# Patient Record
Sex: Female | Born: 1935 | Race: White | Hispanic: No | Smoking: Never smoker
Health system: Southern US, Community
[De-identification: ages and names within clinical notes are randomized; demographics above are authoritative.]

## PROBLEM LIST (undated history)

## (undated) DIAGNOSIS — T7840XA Allergy, unspecified, initial encounter: Secondary | ICD-10-CM

## (undated) HISTORY — PX: APPENDECTOMY: SHX54

## (undated) HISTORY — DX: Allergy, unspecified, initial encounter: T78.40XA

---

## 2015-01-22 ENCOUNTER — Ambulatory Visit (INDEPENDENT_AMBULATORY_CARE_PROVIDER_SITE_OTHER): Payer: Medicare Other | Admitting: Physician Assistant

## 2015-01-22 VITALS — BP 144/80 | HR 87 | Temp 97.7°F | Resp 20 | Ht 62.0 in | Wt 147.0 lb

## 2015-01-22 DIAGNOSIS — H6123 Impacted cerumen, bilateral: Secondary | ICD-10-CM | POA: Diagnosis not present

## 2015-01-22 DIAGNOSIS — Z23 Encounter for immunization: Secondary | ICD-10-CM | POA: Diagnosis not present

## 2015-01-22 DIAGNOSIS — Z139 Encounter for screening, unspecified: Secondary | ICD-10-CM | POA: Diagnosis not present

## 2015-01-22 NOTE — Progress Notes (Signed)
01/22/2015 5:21 PM   DOB: 27-Mar-1927 / MRN: [id number]  SUBJECTIVE:  Teresa Beltran is a 82 y.o. female presenting for a letter stating that she is healthy enough to move into an independent living facility.  She has had a hysterectomy in the 70's and appendectomy when she was in high school.  She has also had her tonsils removed.  She quit smoking in 1993.  She has a 40 pack year history.  She takes no medication and has no complaints today. She had a pneumonia shot two years ago.  She would like a flu shot.      She has No Known Allergies.   She  has a past medical history of Allergy.    She  reports that she has never smoked. She does not have any smokeless tobacco history on file. She reports that she drinks about 0.6 oz of alcohol per week. She reports that she does not use illicit drugs. She  has no sexual activity history on file. The patient  has past surgical history that includes Appendectomy.  Her family history includes Cancer in her mother and sister; Stroke in her father.  Review of Systems  Constitutional: Negative for fever and chills.  Eyes: Negative for blurred vision.  Respiratory: Negative for cough and shortness of breath.   Cardiovascular: Negative for chest pain.  Gastrointestinal: Negative for nausea and abdominal pain.  Genitourinary: Negative for dysuria, urgency and frequency.  Musculoskeletal: Negative for myalgias.  Skin: Negative for rash.  Neurological: Negative for dizziness, tingling and headaches.  Psychiatric/Behavioral: Negative for depression. The patient is not nervous/anxious.     Problem list and medications reviewed and updated by myself where necessary, and exist elsewhere in the encounter.   OBJECTIVE:  BP 144/80 mmHg  Pulse 87  Temp(Src) 97.7 F (36.5 C) (Oral)  Resp 20  Ht 5\' 2"  (1.575 m)  Wt 147 lb (66.679 kg)  BMI 26.88 kg/m2  SpO2 98%  Physical Exam  Constitutional: She is oriented to person, place, and time. She appears  well-developed.  HENT:  Nose: Nose normal.  Mouth/Throat: Uvula is midline, oropharynx is clear and moist and mucous membranes are normal.  Bilateral cerumen impaction.   Eyes: EOM are normal. Pupils are equal, round, and reactive to light.  Cardiovascular: Normal rate and regular rhythm.   Pulmonary/Chest: Effort normal and breath sounds normal.  Abdominal: Soft. Bowel sounds are normal. She exhibits no distension.  Musculoskeletal: Normal range of motion.  Neurological: She is alert and oriented to person, place, and time. No cranial nerve deficit.  Skin: Skin is warm and dry. She is not diaphoretic.  Psychiatric: She has a normal mood and affect.  Vitals reviewed.   No results found for this or any previous visit (from the past 48 hour(s)).  ASSESSMENT AND PLAN  Teresa Beltran was seen today for other.  Diagnoses and all orders for this visit:  Screening: Letter composed and signed.  Patient and her daughter opting to avoid labs at this time.  Will provide a flu shot.  She has recently had a pneumococcal vaccine.    Cerumen impaction, bilateral: Successful lavage bilaterally.   Need for prophylactic vaccination and inoculation against influenza -     Cancel: Flu vaccine > 3yo with preservative IM (Fluvirin Influenza Split) -     Flu Vaccine QUAD 36+ mos IM   The patient was advised to call or return to clinic if she does not see an improvement in symptoms or to  seek the care of the closest emergency department if she worsens with the above plan.   Deliah BostonMichael Briceyda Abdullah, MHS, PA-C [facility] 01/22/2015 5:21 PM

## 2019-02-24 ENCOUNTER — Emergency Department (HOSPITAL_COMMUNITY): Payer: Medicare Other

## 2019-02-24 ENCOUNTER — Other Ambulatory Visit: Payer: Self-pay

## 2019-02-24 ENCOUNTER — Inpatient Hospital Stay (HOSPITAL_COMMUNITY): Payer: Medicare Other

## 2019-02-24 ENCOUNTER — Inpatient Hospital Stay (HOSPITAL_COMMUNITY)
Admission: EM | Admit: 2019-02-24 | Discharge: 2019-02-28 | DRG: 083 | Disposition: A | Discharge status: Hospice | Payer: Medicare Other | Attending: Physician Assistant | Admitting: Physician Assistant

## 2019-02-24 ENCOUNTER — Encounter (HOSPITAL_COMMUNITY): Payer: Self-pay

## 2019-02-24 DIAGNOSIS — S06360A Traumatic hemorrhage of cerebrum, unspecified, without loss of consciousness, initial encounter: Secondary | ICD-10-CM | POA: Diagnosis not present

## 2019-02-24 DIAGNOSIS — S065X9A Traumatic subdural hemorrhage with loss of consciousness of unspecified duration, initial encounter: Secondary | ICD-10-CM | POA: Diagnosis present

## 2019-02-24 DIAGNOSIS — Z20822 Contact with and (suspected) exposure to covid-19: Secondary | ICD-10-CM | POA: Diagnosis present

## 2019-02-24 DIAGNOSIS — Z66 Do not resuscitate: Secondary | ICD-10-CM | POA: Diagnosis not present

## 2019-02-24 DIAGNOSIS — R451 Restlessness and agitation: Secondary | ICD-10-CM | POA: Diagnosis not present

## 2019-02-24 DIAGNOSIS — Z90710 Acquired absence of both cervix and uterus: Secondary | ICD-10-CM

## 2019-02-24 DIAGNOSIS — M4856XA Collapsed vertebra, not elsewhere classified, lumbar region, initial encounter for fracture: Secondary | ICD-10-CM | POA: Diagnosis present

## 2019-02-24 DIAGNOSIS — S066X9A Traumatic subarachnoid hemorrhage with loss of consciousness of unspecified duration, initial encounter: Secondary | ICD-10-CM | POA: Diagnosis present

## 2019-02-24 DIAGNOSIS — I619 Nontraumatic intracerebral hemorrhage, unspecified: Secondary | ICD-10-CM | POA: Diagnosis present

## 2019-02-24 DIAGNOSIS — Z515 Encounter for palliative care: Secondary | ICD-10-CM | POA: Diagnosis not present

## 2019-02-24 DIAGNOSIS — R0681 Apnea, not elsewhere classified: Secondary | ICD-10-CM | POA: Diagnosis not present

## 2019-02-24 DIAGNOSIS — W108XXA Fall (on) (from) other stairs and steps, initial encounter: Secondary | ICD-10-CM | POA: Diagnosis present

## 2019-02-24 DIAGNOSIS — Z7289 Other problems related to lifestyle: Secondary | ICD-10-CM | POA: Diagnosis not present

## 2019-02-24 DIAGNOSIS — Z7189 Other specified counseling: Secondary | ICD-10-CM

## 2019-02-24 DIAGNOSIS — R609 Edema, unspecified: Secondary | ICD-10-CM

## 2019-02-24 LAB — CBC
HCT: 45 % (ref 36.0–46.0)
Hemoglobin: 14.3 g/dL (ref 12.0–15.0)
MCH: 30.4 pg (ref 26.0–34.0)
MCHC: 31.8 g/dL (ref 30.0–36.0)
MCV: 95.7 fL (ref 80.0–100.0)
Platelets: 257 10*3/uL (ref 150–400)
RBC: 4.7 MIL/uL (ref 3.87–5.11)
RDW: 13.6 % (ref 11.5–15.5)
WBC: 23.8 10*3/uL — ABNORMAL HIGH (ref 4.0–10.5)
nRBC: 0 % (ref 0.0–0.2)

## 2019-02-24 LAB — COMPREHENSIVE METABOLIC PANEL
ALT: 19 U/L (ref 0–44)
AST: 27 U/L (ref 15–41)
Albumin: 3.2 g/dL — ABNORMAL LOW (ref 3.5–5.0)
Alkaline Phosphatase: 80 U/L (ref 38–126)
Anion gap: 12 (ref 5–15)
BUN: 20 mg/dL (ref 8–23)
CO2: 23 mmol/L (ref 22–32)
Calcium: 9.1 mg/dL (ref 8.9–10.3)
Chloride: 105 mmol/L (ref 98–111)
Creatinine, Ser: 0.95 mg/dL (ref 0.44–1.00)
GFR calc Af Amer: >60 mL/min (ref >60)
GFR calc non Af Amer: 53 mL/min — ABNORMAL LOW (ref >60)
Glucose, Bld: 187 mg/dL — ABNORMAL HIGH (ref 70–99)
Potassium: 3.8 mmol/L (ref 3.5–5.1)
Sodium: 140 mmol/L (ref 135–145)
Total Bilirubin: 0.8 mg/dL (ref 0.3–1.2)
Total Protein: 6.4 g/dL — ABNORMAL LOW (ref 6.5–8.1)

## 2019-02-24 LAB — RESPIRATORY PANEL BY RT PCR (FLU A&B, COVID)
Influenza A by PCR: NEGATIVE
Influenza B by PCR: NEGATIVE
SARS Coronavirus 2 by RT PCR: NEGATIVE

## 2019-02-24 LAB — URINALYSIS, ROUTINE W REFLEX MICROSCOPIC
Bilirubin Urine: NEGATIVE
Glucose, UA: 50 mg/dL — AB
Ketones, ur: NEGATIVE mg/dL
Nitrite: NEGATIVE
Protein, ur: 30 mg/dL — AB
RBC / HPF: >50 RBC/hpf — ABNORMAL HIGH (ref 0–5)
Specific Gravity, Urine: 1.032 — ABNORMAL HIGH (ref 1.005–1.030)
WBC, UA: >50 WBC/hpf — ABNORMAL HIGH (ref 0–5)
pH: 6 (ref 5.0–8.0)

## 2019-02-24 LAB — PROTIME-INR
INR: 1 (ref 0.8–1.2)
Prothrombin Time: 12.5 seconds (ref 11.4–15.2)

## 2019-02-24 LAB — MRSA PCR SCREENING: MRSA by PCR: NEGATIVE

## 2019-02-24 MED ORDER — METHOCARBAMOL 1000 MG/10ML IJ SOLN
1000.0000 mg | Freq: Three times a day (TID) | INTRAVENOUS | Status: DC
  Administered 2019-02-24 – 2019-02-26 (×6): 1000 mg via INTRAVENOUS
  Filled 2019-02-24 (×10): qty 10

## 2019-02-24 MED ORDER — MORPHINE SULFATE (PF) 2 MG/ML IV SOLN
1.0000 mg | INTRAVENOUS | Status: DC | PRN
  Administered 2019-02-24: 2 mg via INTRAVENOUS
  Filled 2019-02-24: qty 1

## 2019-02-24 MED ORDER — DOCUSATE SODIUM 100 MG PO CAPS: 100.0000 mg | ORAL_CAPSULE | Freq: Two times a day (BID) | ORAL | Status: DC

## 2019-02-24 MED ORDER — SODIUM CHLORIDE 0.9 % IV SOLN: INTRAVENOUS | Status: DC

## 2019-02-24 MED ORDER — LEVETIRACETAM IN NACL 500 MG/100ML IV SOLN
500.0000 mg | Freq: Once | INTRAVENOUS | Status: AC
  Administered 2019-02-24: 500 mg via INTRAVENOUS
  Filled 2019-02-24: qty 100

## 2019-02-24 MED ORDER — IOHEXOL 300 MG/ML  SOLN
100.0000 mL | Freq: Once | INTRAMUSCULAR | Status: AC | PRN
  Administered 2019-02-24: 100 mL via INTRAVENOUS

## 2019-02-24 MED ORDER — ONDANSETRON HCL 4 MG/2ML IJ SOLN: 4.0000 mg | Freq: Four times a day (QID) | INTRAMUSCULAR | Status: DC | PRN

## 2019-02-24 MED ORDER — LEVETIRACETAM IN NACL 500 MG/100ML IV SOLN: 500.0000 mg | Freq: Two times a day (BID) | INTRAVENOUS | Status: DC

## 2019-02-24 MED ORDER — ACETAMINOPHEN 10 MG/ML IV SOLN
1000.0000 mg | Freq: Four times a day (QID) | INTRAVENOUS | Status: AC
  Administered 2019-02-24 (×3): 1000 mg via INTRAVENOUS
  Filled 2019-02-24 (×3): qty 100

## 2019-02-24 MED ORDER — LABETALOL HCL 5 MG/ML IV SOLN
5.0000 mg | Freq: Once | INTRAVENOUS | Status: AC
  Administered 2019-02-24: 5 mg via INTRAVENOUS
  Filled 2019-02-24: qty 4

## 2019-02-24 MED ORDER — LEVETIRACETAM IN NACL 500 MG/100ML IV SOLN
500.0000 mg | Freq: Two times a day (BID) | INTRAVENOUS | Status: DC
  Administered 2019-02-24 – 2019-02-28 (×9): 500 mg via INTRAVENOUS
  Filled 2019-02-24 (×10): qty 100

## 2019-02-24 MED ORDER — CHLORHEXIDINE GLUCONATE CLOTH 2 % EX PADS
6.0000 | MEDICATED_PAD | Freq: Every day | CUTANEOUS | Status: DC
  Administered 2019-02-24 – 2019-02-25 (×2): 6 via TOPICAL

## 2019-02-24 MED ORDER — ONDANSETRON 4 MG PO TBDP: 4.0000 mg | ORAL_TABLET | Freq: Four times a day (QID) | ORAL | Status: DC | PRN

## 2019-02-24 NOTE — ED Provider Notes (Addendum)
[facility] EMERGENCY DEPARTMENT Provider Note   CSN: [id number] Arrival date & time: 02/24/19  0429     History Chief Complaint  Patient presents with  . Fall    Teresa Beltran is a 86 y.o. female.  Patient presents to the emergency department for evaluation after a fall downstairs.  She comes via EMS.  Patient is reportedly staying with her daughter, got up to go to the bathroom and mistakenly fell down the steps into the cellar.  There are approximately 10 steps.  Patient with multiple skin tears and a large hematoma of the left forehead.  Patient has no past medical history, takes no medications.  EMS was told by the patient's daughter that she is normally very sharp, alert and oriented.  She has been confused during transport but awake and alert. Level V Caveat due to mental status change.        Past Medical History:  Diagnosis Date  . Allergy     There are no problems to display for this patient.   Past Surgical History:  Procedure Laterality Date  . APPENDECTOMY       OB History   No obstetric history on file.     Family History  Problem Relation Age of Onset  . Cancer Mother   . Stroke Father   . Cancer Sister     Social History   Tobacco Use  . Smoking status: Never Smoker  . Smokeless tobacco: Never Used  Substance Use Topics  . Alcohol use: Yes    Alcohol/week: 1.0 standard drinks    Types: 1 Standard drinks or equivalent per week  . Drug use: No    Home Medications Prior to Admission medications   Not on File    Allergies    Patient has no known allergies.  Review of Systems   Review of Systems  Unable to perform ROS: Mental status change    Physical Exam Updated Vital Signs BP (!) 148/85 (BP Location: Left Wrist)   Pulse 71   Temp (!) 97.4 F (36.3 C) (Oral)   Resp 17   SpO2 99%   Physical Exam Vitals and nursing note reviewed.  Constitutional:      General: She is not in acute distress.  Appearance: She is well-developed.  HENT:     Head: Normocephalic. Contusion (large, left forehead) present.     Right Ear: Hearing normal.     Left Ear: Hearing normal.     Nose: Nose normal.  Eyes:     Conjunctiva/sclera: Conjunctivae normal.     Pupils: Pupils are equal, round, and reactive to light.  Cardiovascular:     Rate and Rhythm: Regular rhythm.     Heart sounds: S1 normal and S2 normal. No murmur. No friction rub. No gallop.   Pulmonary:     Effort: Pulmonary effort is normal. No respiratory distress.     Breath sounds: Normal breath sounds.  Chest:     Chest wall: No tenderness.  Abdominal:     General: Bowel sounds are normal.     Palpations: Abdomen is soft.     Tenderness: There is no abdominal tenderness. There is no guarding or rebound. Negative signs include Murphy's sign and McBurney's sign.     Hernia: No hernia is present.  Musculoskeletal:        General: Normal range of motion.     Cervical back: Normal range of motion and neck supple.  Skin:    General:  Skin is warm and dry.     Comments: Skin tear right upper arm, dorsum right hand, left upper arm and right knee  Neurological:     Mental Status: She is alert. She is disoriented.     GCS: GCS eye subscore is 4. GCS verbal subscore is 4. GCS motor subscore is 6.     Cranial Nerves: No cranial nerve deficit.     Sensory: No sensory deficit.     Coordination: Coordination normal.     ED Results / Procedures / Treatments   Labs (all labs ordered are listed, but only abnormal results are displayed) Labs Reviewed  CBC - Abnormal; Notable for the following components:      Result Value   WBC 23.8 (*)    All other components within normal limits  COMPREHENSIVE METABOLIC PANEL - Abnormal; Notable for the following components:   Glucose, Bld 187 (*)    Total Protein 6.4 (*)    Albumin 3.2 (*)    GFR calc non Af Amer 53 (*)    All other components within normal limits  RESPIRATORY PANEL BY RT PCR (FLU  A&B, COVID)  PROTIME-INR  URINALYSIS, ROUTINE W REFLEX MICROSCOPIC  I-STAT CHEM 8, ED    EKG EKG Interpretation  Date/Time:  Monday February 24 2019 04:36:11 EST Ventricular Rate:  70 PR Interval:    QRS Duration: 99 QT Interval:  408 QTC Calculation: 441 R Axis:   57 Text Interpretation: Sinus rhythm Minimal ST depression, diffuse leads No previous tracing Confirmed by Gilda Crease [phone]) on 02/24/2019 4:37:15 AM   Radiology DG Pelvis 1-2 Views  Result Date: 02/24/2019 CLINICAL DATA:  Fall down 10 stairs. EXAM: PELVIS - 1-2 VIEW COMPARISON:  None. FINDINGS: Cortical irregularity of the right superior and inferior pubic rami. Pubic symphysis and sacroiliac joints are congruent. Both femoral heads are well-seated in the respective acetabula. Stool ball distends the rectum. IMPRESSION: Cortical irregularity of the right superior and inferior pubic rami, suspicious for fracture, but age indeterminate. Electronically Signed   By: Narda Rutherford M.D.   On: 02/24/2019 05:37   CT HEAD WO CONTRAST  Result Date: 02/24/2019 CLINICAL DATA:  Poly trauma due to fall down steps. EXAM: CT HEAD WITHOUT CONTRAST CT CERVICAL SPINE WITHOUT CONTRAST TECHNIQUE: Multidetector CT imaging of the head and cervical spine was performed following the standard protocol without intravenous contrast. Multiplanar CT image reconstructions of the cervical spine were also generated. COMPARISON:  None. FINDINGS: CT HEAD FINDINGS Brain: Up to 4 cm left occipital hematoma with thin rim of edema. There is moderate regional subarachnoid hemorrhage. High right frontal parenchymal hematoma also measuring up to 4 cm anterior to posterior and with local subarachnoid hemorrhage. Small volume subarachnoid hemorrhage and probable contusion along the inferolateral left temporal lobe. Small subdural hematoma along the left tentorium measuring 3-4 mm in maximal thickness. Trace subarachnoid hemorrhage is also seen along the  right occipital convexity and parasagittal left frontal convexity. No acute infarct, hydrocephalus, or masslike finding. Medial temporal volume loss which is moderate. Vascular: No hyperdense vessel or unexpected calcification. Skull: Negative for fracture. There is a large left frontal scalp hematoma. Sinuses/Orbits: No evidence of injury CT CERVICAL SPINE FINDINGS Alignment: No traumatic malalignment Skull base and vertebrae: Negative for acute fracture Soft tissues and spinal canal: No prevertebral fluid or swelling. No visible canal hematoma. Disc levels:  Ordinary degenerative changes Upper chest: No acute finding. Calcified granuloma at the left apex. Other: Critical Value/emergent results were called  by telephone at the time of interpretation on 02/24/2019 at 4:59 am to provider [facility] , who verbally acknowledged these results. IMPRESSION: 1. History of trauma with right frontal and left occipital parenchymal hematomas measuring up to 4 cm each. There is also multifocal subarachnoid hemorrhage and a trace left tentorial subdural hemorrhage. 2. Left frontal scalp hematoma without calvarial fracture. 3. Negative for cervical spine fracture. Electronically Signed   By: Monte Fantasia M.D.   On: 02/24/2019 05:03   CT CERVICAL SPINE WO CONTRAST  Result Date: 02/24/2019 CLINICAL DATA:  Poly trauma due to fall down steps. EXAM: CT HEAD WITHOUT CONTRAST CT CERVICAL SPINE WITHOUT CONTRAST TECHNIQUE: Multidetector CT imaging of the head and cervical spine was performed following the standard protocol without intravenous contrast. Multiplanar CT image reconstructions of the cervical spine were also generated. COMPARISON:  None. FINDINGS: CT HEAD FINDINGS Brain: Up to 4 cm left occipital hematoma with thin rim of edema. There is moderate regional subarachnoid hemorrhage. High right frontal parenchymal hematoma also measuring up to 4 cm anterior to posterior and with local subarachnoid hemorrhage. Small  volume subarachnoid hemorrhage and probable contusion along the inferolateral left temporal lobe. Small subdural hematoma along the left tentorium measuring 3-4 mm in maximal thickness. Trace subarachnoid hemorrhage is also seen along the right occipital convexity and parasagittal left frontal convexity. No acute infarct, hydrocephalus, or masslike finding. Medial temporal volume loss which is moderate. Vascular: No hyperdense vessel or unexpected calcification. Skull: Negative for fracture. There is a large left frontal scalp hematoma. Sinuses/Orbits: No evidence of injury CT CERVICAL SPINE FINDINGS Alignment: No traumatic malalignment Skull base and vertebrae: Negative for acute fracture Soft tissues and spinal canal: No prevertebral fluid or swelling. No visible canal hematoma. Disc levels:  Ordinary degenerative changes Upper chest: No acute finding. Calcified granuloma at the left apex. Other: Critical Value/emergent results were called by telephone at the time of interpretation on 02/24/2019 at 4:59 am to provider [facility] , who verbally acknowledged these results. IMPRESSION: 1. History of trauma with right frontal and left occipital parenchymal hematomas measuring up to 4 cm each. There is also multifocal subarachnoid hemorrhage and a trace left tentorial subdural hemorrhage. 2. Left frontal scalp hematoma without calvarial fracture. 3. Negative for cervical spine fracture. Electronically Signed   By: Monte Fantasia M.D.   On: 02/24/2019 05:03   DG Chest Port 1 View  Result Date: 02/24/2019 CLINICAL DATA:  Fall down 10 stairs. EXAM: PORTABLE CHEST 1 VIEW COMPARISON:  None. FINDINGS: Patient is rotated.The cardiomediastinal contours are normal for rotation. Multiple skin folds project over both hemi thoraces. Calcified granuloma at the left lung apex. Pulmonary vasculature is normal. No consolidation, pleural effusion, or pneumothorax. Chronic deformity of the left proximal humerus. IMPRESSION:  No evidence of acute traumatic injury. Electronically Signed   By: Keith Rake M.D.   On: 02/24/2019 05:36    Procedures .Critical Care Performed by: Orpah Greek, MD Authorized by: Orpah Greek, MD   Critical care provider statement:    Critical care time (minutes):  35   Critical care time was exclusive of:  Separately billable procedures and treating other patients   Critical care was necessary to treat or prevent imminent or life-threatening deterioration of the following conditions:  Trauma   Critical care was time spent personally by me on the following activities:  Ordering and performing treatments and interventions, ordering and review of laboratory studies, ordering and review of radiographic studies, discussions with consultants, pulse  oximetry, re-evaluation of patient's condition, review of old charts and examination of patient   I assumed direction of critical care for this patient from another provider in my specialty: no     (including critical care time)  Medications Ordered in ED Medications  levETIRAcetam (KEPPRA) IVPB 500 mg/100 mL premix (has no administration in time range)  iohexol (OMNIPAQUE) 300 MG/ML solution 100 mL (100 mLs Intravenous Contrast Given 02/24/19 0537)    ED Course  I have reviewed the triage vital signs and the nursing notes.  Pertinent labs & imaging results that were available during my care of the patient were reviewed by me and considered in my medical decision making (see chart for details).    MDM Rules/Calculators/A&P                     Patient presents to the emergency department after a fall.  Patient accidentally fell down a flight of steps into the cellar of her daughter's house.  It is assumed that she was trying to go to the bathroom and got confused, went through the wrong door.  Patient is reportedly normally alert and oriented but is having difficulty answering simple questions here in the ER.  She was  noted to have a large hematoma of the left forehead with multiple skin tears at arrival.  CT head shows multiple intraparenchymal, subdural, subarachnoid bleeds as outlined above.  Patient not moving her left arm but it is unclear if this is because of the previous trauma noted on x-ray with chronic nonhealed proximal humerus fracture or if this is secondary to head injury.  Discussed with Dr. Maisie Fus, on-call for neurosurgery.  Confirms that patient does not require neurosurgical intervention.  Recommends prophylactic Keppra, raise head of bed 30 degrees, keep systolic blood pressure less than 140 and repeat CT tomorrow (Tuesday) morning.  Will follow.  Patient will require hospitalization.  Trauma service consulted and will admit patient. Discussed with Dr. Bedelia Person.  Addendum 6:05AM - patient becoming somewhat agitated.  She is trying to climb out of the bed.  I do not want to sedate her because of the head injury, will place in soft wrist restraints.  Addendum 6:30AM -pressure elevating, now over 160 systolic.  Patient given labetalol 5 mg IV and repeat blood pressure 111 systolic.  Final Clinical Impression(s) / ED Diagnoses Final diagnoses:  Traumatic hemorrhage of cerebrum without loss of consciousness, unspecified laterality, initial encounter [facility])    Rx / DC Orders ED Discharge Orders    None       Fabio Wah, Canary Brim, MD 02/24/19 [id number]    Gilda Crease, MD 02/24/19 2549    Gilda Crease, MD 02/24/19 [phone]

## 2019-02-24 NOTE — Progress Notes (Signed)
Neurosurgery  Reviewed CT head without contrast of this 86 year old woman status post fall down the stairs, GCS13. She has numerous intraparenchymal contusions And right frontal lobe, left right occipital lobe and in the left temple of, with associated Traumatic subarachnoid hemorrhage.  No neurosurgical intervention as indicated. Recommend a repeat CT head be obtained in 24 hours with  Systolic blood pressure controlled less t han 140 and Keppra times seven days. She will need to be admitted to the Trauma service with ICU care for neuro checks but I do not for see any surgical intervention even in the event of neurologic decline.

## 2019-02-24 NOTE — ED Notes (Signed)
PIV initiated, 18 G to LAC. IV flushes with 10cc NS without s/s of infiltration. Positive blood return. Secured with tape and tegaderm.

## 2019-02-24 NOTE — ED Notes (Signed)
Dr. Blinda Leatherwood notified of BP 165/99

## 2019-02-24 NOTE — Progress Notes (Signed)
PT Cancellation Note  Patient Details Name: Teresa Beltran MRN: [id number] DOB: 04-18-1927   Cancelled Treatment:    Reason Eval/Treat Not Completed: Medical issues which prohibited therapy; patient with guarded prognosis.  Will sign off for now.  Order PT again if appropriate.  Thanks.   Elray Mcgregor 02/24/2019, 1:11 PM Sheran Lawless, PT Acute Rehabilitation Services [phone] 02/24/2019

## 2019-02-24 NOTE — Consult Note (Addendum)
Consultation Note Date:  02/25/19  Patient Name: Teresa Beltran  DOB: [birth year]  MRN: [id number]  Age / Sex: 86 y.o., female  PCP: Patient, No Pcp Per Referring Physician: Md, Trauma, MD  Reason for Consultation: Establishing goals of care  HPI/Patient Profile: 86 y.o. female  with past medical history of appendectomy, hysterectomy admitted on 02/24/2019 with fall down stairs with moderate sized parenchymal hematomas to right frontal and left occipital measuring 4 cm each with multifocal subarachnoid hemorrhage and trace left tentorial subdural hemorrhage. Also with L2 compression fracture.   Clinical Assessment and Goals of Care: I met today at Ms. Weingartner' bedside with daughter, Gerald Stabs. Gerald Stabs is her mother's HCPOA and only local family. Ms. Salman has 2 other adult children who are in  and [place]. 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 She is an amazing woman who does not like change but values her independence and quality of life. Although Ms. Selmon struggled to discuss with Gerald Stabs end of life wishes Gerald Stabs know what is important to her mother.   We discussed different pathways and Gerald Stabs knows that her mother would not desire any artificial measures to keep her alive including feeding tube. She also does not feel that her mother would be happy with a much declined quality of life (she was still angry with family for convincing her to sell her car so she wouldn't be driving). With all this in mind we agreed that comfort care would be best and most aligned with Ms. Andrepont' life values. We discussed about where this care should take place and Gerald Stabs has had experience at [facility] and would be interested in pursuing hospice. She will continue to speak with her family about these decisions.   All questions/concerns addressed.  Emotional support provided.   Primary Decision Maker HCPOA daughter Gerald Stabs    SUMMARY OF RECOMMENDATIONS   - Comfort is focus of care  Code Status/Advance Care Planning:  DNR   Symptom Management:   PRN medications added to ensure comfort.   Palliative Prophylaxis:   Aspiration and Delirium Protocol  Additional Recommendations (Limitations, Scope, Preferences):  Full Comfort Care  Psycho-social/Spiritual:   Desire for further Chaplaincy support:no  Additional Recommendations: Education on Hospice and Grief/Bereavement Support  Prognosis:   < 2 weeks very likely  Discharge Planning: Hospice facility      Primary Diagnoses: Present on Admission: . ICH (intracerebral hemorrhage) ([place])   I have reviewed the medical record, interviewed the patient and family, and examined the patient. The following aspects are pertinent.  Past Medical History:  Diagnosis Date  . Allergy    Social History   Socioeconomic History  . Marital status: Divorced    Spouse name: Not on file  . Number of children: Not on file  . Years of education: Not on file  . Highest education level: Not on file  Occupational History  . Not on file  Tobacco Use  .  Smoking status: Never Smoker  . Smokeless tobacco: Never Used  Substance and Sexual Activity  . Alcohol use: Yes    Alcohol/week: 1.0 standard drinks    Types: 1 Standard drinks or equivalent per week  . Drug use: No  . Sexual activity: Not on file  Other Topics Concern  . Not on file  Social History Narrative  . Not on file   Social Determinants of Health   Financial Resource Strain:   . Difficulty of Paying Living Expenses: Not on file  Food Insecurity:   . Worried About Charity fundraiser in the Last Year: Not on file  . Ran Out of Food in the Last Year: Not on file  Transportation Needs:   . Lack of Transportation (Medical): Not on file  . Lack of Transportation (Non-Medical): Not on file  Physical Activity:     . Days of Exercise per Week: Not on file  . Minutes of Exercise per Session: Not on file  Stress:   . Feeling of Stress : Not on file  Social Connections:   . Frequency of Communication with Friends and Family: Not on file  . Frequency of Social Gatherings with Friends and Family: Not on file  . Attends Religious Services: Not on file  . Active Member of Clubs or Organizations: Not on file  . Attends Archivist Meetings: Not on file  . Marital Status: Not on file   Family History  Problem Relation Age of Onset  . Cancer Mother   . Stroke Father   . Cancer Sister    Scheduled Meds: . Chlorhexidine Gluconate Cloth  6 each Topical Q0600  . docusate sodium  100 mg Oral BID   Continuous Infusions: . sodium chloride 75 mL/hr at 02/24/19 0811  . acetaminophen 1,000 mg (02/24/19 0845)  . levETIRAcetam Stopped (02/24/19 0826)  . methocarbamol (ROBAXIN) IV     PRN Meds:.morphine injection, ondansetron **OR** ondansetron (ZOFRAN) IV No Known Allergies Review of Systems  Unable to perform ROS: Acuity of condition    Physical Exam Vitals and nursing note reviewed.  Constitutional:      General: She is not in acute distress.    Appearance: She is ill-appearing.     Comments: Frail, elderly  Pulmonary:     Effort: Pulmonary effort is normal. No tachypnea, accessory muscle usage or respiratory distress.  Abdominal:     General: Abdomen is flat.     Palpations: Abdomen is soft.  Neurological:     Comments: Withdraws to pain; no purposeful movement of verbal response     Vital Signs: BP (!) 120/57 Comment (BP Location): post void (foley inserted)  Pulse 63   Temp 97.6 F (36.4 C) (Axillary)   Resp 13   SpO2 98%  Pain Scale: 0-10   Pain Score: Asleep   SpO2: SpO2: 98 % O2 Device:SpO2: 98 % O2 Flow Rate: .   IO: Intake/output summary: No intake or output data in the 24 hours ending 02/24/19 1040  LBM:   Baseline Weight:   Most recent weight:        Palliative Assessment/Data: 20%     Time In: 1030 Time Out: 1200 Time Total: 90 min Greater than 50%  of this time was spent counseling and coordinating care related to the above assessment and plan.  Signed by: Vinie Sill, NP Palliative Medicine Team Pager # [phone] (M-F 8a-5p) Team Phone # [phone] (Nights/Weekends)

## 2019-02-24 NOTE — ED Notes (Signed)
EKG completed. Labs drawn, labeled with 2 pt identifiers, and sent to lab Pt taken to CT scan with this RN on continuous monitors

## 2019-02-24 NOTE — Consult Note (Signed)
  Chief Complaint   Chief Complaint  Patient presents with  . Fall    History of Present Illness  Teresa Beltran is a 86 y.o.  woman who fell down 1 flight of stairs.  She was brought to the hospital with reported GCS 13, confused and sleepy.  She had a large scalp hematoma and external bruising.  CT head without contrast showed multiple intraparenchymal contusions/hematomas of moderate size, small tentorial SDH, and associated traumatic SAH.  She also had a L2 compression fracture of unknown chronicity and knee swelling noted.    Past Medical History   Past Medical History:  Diagnosis Date  . Allergy     Past Surgical History   Past Surgical History:  Procedure Laterality Date  . APPENDECTOMY      Social History   Social History   Tobacco Use  . Smoking status: Never Smoker  . Smokeless tobacco: Never Used  Substance Use Topics  . Alcohol use: Yes    Alcohol/week: 1.0 standard drinks    Types: 1 Standard drinks or equivalent per week  . Drug use: No    Medications   Prior to Admission medications   Not on File    Allergies  No Known Allergies  Review of Systems  ROS  Neurologic Exam  Eyes closed, PERRL Oriented to person only, nonconversant.  Says short phrases but nonsensical. MAEs x 4 > antigravity, follows only simple commands. GCS 10-11  No obvious neck tenderness  Imaging  CT head without contrast reviewed.  See HPI  CT C-spine shows no acute fracture Impression  - 86 y.o. female s/p fall who has suffered multiple brain contusions and ICHs s/p fall.  Plan  - no neurosurgical intervention can be offered that would change the prognosis of functional recovery - in the event of neurologic decline, her chance of good/independent functional recovery would be dismal, so I agree with palliative care consult. - if continuing with aggressive treatment, would recommend a repeat CT head 2/16 to determine if safe to start pharmacologic DVT prophylaxis -  I cleared the patient's C-collar given no radiographic evidence of injury, no obvious tenderness, and in this clinical context, the collar would do more harm/discomfort than good.

## 2019-02-24 NOTE — ED Notes (Addendum)
Pt becoming more restless and confused on cart. Pt trying to get out of bed. Continuously grabbing at monitor cords and pulling them off. Pt non-redirectable. Unable to get accurate BP due to restlessness. Dr. Blinda Leatherwood aware and at bedside

## 2019-02-24 NOTE — ED Notes (Signed)
Labetalol given per [facility]

## 2019-02-24 NOTE — ED Notes (Signed)
Care endorsed to Katie, RN 

## 2019-02-24 NOTE — ED Notes (Signed)
Pt taken back to CT with this RN

## 2019-02-24 NOTE — ED Triage Notes (Addendum)
Pt brought in by [facility] with c/o fall. Pt is staying at her daughter's house and opened the wrong door while trying to get to the bathroom. Pt fell down ten steps in the basement onto concrete floor. Pt with large hematoma to L side of forehead. Skin tears present to bilateral arms and hands and abrasion noted to R knee. Pt normally intermittently confused, but A&OX4 at baseline per family. Pt presently disoriented to place, time, and situation. C - collar in place by EMS. +L side gaze deviation and L sided upper extremity weakness/neglect

## 2019-02-24 NOTE — ED Notes (Signed)
X-ray at bedside

## 2019-02-24 NOTE — ED Notes (Signed)
Pt back to Rm 30 from CT. Pt with L sided upper extremity weakness and L-sided gaze deviation. Dr. Blinda Leatherwood notified and at bedside

## 2019-02-24 NOTE — ED Notes (Signed)
Soft restraints applied for safety of the patient
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
## 2019-02-24 NOTE — ED Notes (Signed)
MD lovett at bedside

## 2019-02-25 ENCOUNTER — Inpatient Hospital Stay (HOSPITAL_COMMUNITY): Payer: Medicare Other

## 2019-02-25 DIAGNOSIS — Z515 Encounter for palliative care: Secondary | ICD-10-CM

## 2019-02-25 DIAGNOSIS — Z7189 Other specified counseling: Secondary | ICD-10-CM

## 2019-02-25 DIAGNOSIS — S06360A Traumatic hemorrhage of cerebrum, unspecified, without loss of consciousness, initial encounter: Secondary | ICD-10-CM

## 2019-02-25 LAB — CBC
HCT: 39.4 % (ref 36.0–46.0)
Hemoglobin: 13 g/dL (ref 12.0–15.0)
MCH: 30.7 pg (ref 26.0–34.0)
MCHC: 33 g/dL (ref 30.0–36.0)
MCV: 92.9 fL (ref 80.0–100.0)
Platelets: 214 10*3/uL (ref 150–400)
RBC: 4.24 MIL/uL (ref 3.87–5.11)
RDW: 13.7 % (ref 11.5–15.5)
WBC: 9.3 10*3/uL (ref 4.0–10.5)
nRBC: 0 % (ref 0.0–0.2)

## 2019-02-25 LAB — BASIC METABOLIC PANEL
Anion gap: 10 (ref 5–15)
BUN: 14 mg/dL (ref 8–23)
CO2: 22 mmol/L (ref 22–32)
Calcium: 8.8 mg/dL — ABNORMAL LOW (ref 8.9–10.3)
Chloride: 108 mmol/L (ref 98–111)
Creatinine, Ser: 0.78 mg/dL (ref 0.44–1.00)
GFR calc Af Amer: >60 mL/min (ref >60)
GFR calc non Af Amer: >60 mL/min (ref >60)
Glucose, Bld: 131 mg/dL — ABNORMAL HIGH (ref 70–99)
Potassium: 3.5 mmol/L (ref 3.5–5.1)
Sodium: 140 mmol/L (ref 135–145)

## 2019-02-25 LAB — MAGNESIUM: Magnesium: 1.9 mg/dL (ref 1.7–2.4)

## 2019-02-25 LAB — PHOSPHORUS: Phosphorus: 3 mg/dL (ref 2.5–4.6)

## 2019-02-25 MED ORDER — BIOTENE DRY MOUTH MT LIQD
15.0000 mL | OROMUCOSAL | Status: DC | PRN
  Administered 2019-02-26: 15 mL via TOPICAL

## 2019-02-25 MED ORDER — GLYCOPYRROLATE 0.2 MG/ML IJ SOLN: 0.2000 mg | INTRAMUSCULAR | Status: DC | PRN

## 2019-02-25 MED ORDER — MAGNESIUM SULFATE IN D5W 1-5 GM/100ML-% IV SOLN
1.0000 g | Freq: Once | INTRAVENOUS | Status: AC
  Administered 2019-02-25: 1 g via INTRAVENOUS
  Filled 2019-02-25 (×2): qty 100

## 2019-02-25 MED ORDER — ORAL CARE MOUTH RINSE
15.0000 mL | Freq: Two times a day (BID) | OROMUCOSAL | Status: DC
  Administered 2019-02-25 – 2019-02-28 (×5): 15 mL via OROMUCOSAL

## 2019-02-25 MED ORDER — POTASSIUM CHLORIDE 10 MEQ/100ML IV SOLN
10.0000 meq | INTRAVENOUS | Status: AC
  Administered 2019-02-25 (×4): 10 meq via INTRAVENOUS
  Filled 2019-02-25 (×4): qty 100

## 2019-02-25 MED ORDER — GLYCOPYRROLATE 1 MG PO TABS
1.0000 mg | ORAL_TABLET | ORAL | Status: DC | PRN
  Filled 2019-02-25: qty 1

## 2019-02-25 MED ORDER — LORAZEPAM 2 MG/ML IJ SOLN: 1.0000 mg | Freq: Four times a day (QID) | INTRAMUSCULAR | Status: DC | PRN

## 2019-02-25 MED ORDER — MORPHINE SULFATE (PF) 2 MG/ML IV SOLN
1.0000 mg | INTRAVENOUS | Status: DC | PRN
  Administered 2019-02-25 – 2019-02-27 (×5): 2 mg via INTRAVENOUS
  Filled 2019-02-25 (×5): qty 1

## 2019-02-25 MED ORDER — POLYVINYL ALCOHOL 1.4 % OP SOLN
1.0000 [drp] | Freq: Four times a day (QID) | OPHTHALMIC | Status: DC | PRN
  Filled 2019-02-25: qty 15

## 2019-02-25 NOTE — Progress Notes (Signed)
Pt responding differently taken to CT early

## 2019-02-25 NOTE — Progress Notes (Signed)
OT Cancellation Note  Patient Details Name: Teresa Beltran MRN: [id number] DOB: 12/07/1927   Cancelled Treatment:    Reason Eval/Treat Not Completed: Medical issues which prohibited therapy. CT Head repeated this morning, results noted. Decreased arousal. Pt also with possible palliative consult. Per discussion with RN, will sign off for now  [facility] MSOT, OTR/L Acute Rehab Pager: [phone] Office: [phone] 02/25/2019, 9:25 AM

## 2019-02-25 NOTE — Progress Notes (Signed)
Trauma/Critical Care Follow Up Note  Subjective:    Overnight Issues: Not oriented to self, which was a change, prompted early repeat CT showing increased ICH and new midline shift.  Objective:  Vital signs for last 24 hours: Temp:  [97.5 F (36.4 C)-98.7 F (37.1 C)] 98.6 F (37 C) (02/16 0400) Pulse Rate:  [58-92] 67 (02/16 0800) Resp:  [12-20] 13 (02/16 0800) BP: (94-159)/(46-87) 122/57 (02/16 0800) SpO2:  [96 %-100 %] 100 % (02/16 0800) Weight:  [51.3 kg] 51.3 kg (02/16 0148)  Hemodynamic parameters for last 24 hours:    Intake/Output from previous day: 02/15 0701 - 02/16 0700 In: 2196.2 [I.V.:1503.7; IV Piggyback:692.6] Out: 1400 [Urine:1400]  Intake/Output this shift: Total I/O In: 206 [I.V.:106; IV Piggyback:100] Out: -   Vent settings for last 24 hours:    Physical Exam:  Gen: comfortable, no distress Neuro: non-verbal, GCS7(E1V1M5) for me this AM HEENT: PERRL Neck: supple CV: RRR Pulm: unlabored breathing Abd: soft, NT GU: clear yellow urine Extr: wwp, no edema   Results for orders placed or performed during the hospital encounter of 02/24/19 (from the past 24 hour(s))  MRSA PCR Screening     Status: None   Collection Time: 02/24/19  8:48 AM   Specimen: Nasopharyngeal  Result Value Ref Range   MRSA by PCR NEGATIVE NEGATIVE  Urinalysis, Routine w reflex microscopic     Status: Abnormal   Collection Time: 02/24/19  9:08 AM  Result Value Ref Range   Color, Urine YELLOW YELLOW   APPearance CLOUDY (A) CLEAR   Specific Gravity, Urine 1.032 (H) 1.005 - 1.030   pH 6.0 5.0 - 8.0   Glucose, UA 50 (A) NEGATIVE mg/dL   Hgb urine dipstick LARGE (A) NEGATIVE   Bilirubin Urine NEGATIVE NEGATIVE   Ketones, ur NEGATIVE NEGATIVE mg/dL   Protein, ur 30 (A) NEGATIVE mg/dL   Nitrite NEGATIVE NEGATIVE   Leukocytes,Ua LARGE (A) NEGATIVE   RBC / HPF >50 (H) 0 - 5 RBC/hpf   WBC, UA >50 (H) 0 - 5 WBC/hpf   Bacteria, UA RARE (A) NONE SEEN   Squamous Epithelial /  LPF 0-5 0 - 5   WBC Clumps PRESENT    Mucus PRESENT   CBC     Status: None   Collection Time: 02/25/19  5:05 AM  Result Value Ref Range   WBC 9.3 4.0 - 10.5 K/uL   RBC 4.24 3.87 - 5.11 MIL/uL   Hemoglobin 13.0 12.0 - 15.0 g/dL   HCT 39.4 36.0 - 46.0 %   MCV 92.9 80.0 - 100.0 fL   MCH 30.7 26.0 - 34.0 pg   MCHC 33.0 30.0 - 36.0 g/dL   RDW 13.7 11.5 - 15.5 %   Platelets 214 150 - 400 K/uL   nRBC 0.0 0.0 - 0.2 %  Basic metabolic panel     Status: Abnormal   Collection Time: 02/25/19  5:05 AM  Result Value Ref Range   Sodium 140 135 - 145 mmol/L   Potassium 3.5 3.5 - 5.1 mmol/L   Chloride 108 98 - 111 mmol/L   CO2 22 22 - 32 mmol/L   Glucose, Bld 131 (H) 70 - 99 mg/dL   BUN 14 8 - 23 mg/dL   Creatinine, Ser 0.78 0.44 - 1.00 mg/dL   Calcium 8.8 (L) 8.9 - 10.3 mg/dL   GFR calc non Af Amer >60 >60 mL/min   GFR calc Af Amer >60 >60 mL/min   Anion gap 10 5 -  15  Magnesium     Status: None   Collection Time: 02/25/19  5:05 AM  Result Value Ref Range   Magnesium 1.9 1.7 - 2.4 mg/dL  Phosphorus     Status: None   Collection Time: 02/25/19  5:05 AM  Result Value Ref Range   Phosphorus 3.0 2.5 - 4.6 mg/dL    Assessment & Plan: The plan of care was discussed with the bedside nurse for the day, Candise Bowens, who is in agreement with this plan and no additional concerns were raised.   Present on Admission: . ICH (intracerebral hemorrhage) (HCC)    LOS: 1 day   Additional comments:I reviewed the patient's new clinical lab test results.   and I reviewed the patients new imaging test results.    Teresa Beltran s/p fall down stairs  R frontal and L occipital IPH, multi-focal SAH, L tentorial SDH - NSGY c/s (Dr. Maisie Fus), worsened head CT this AM. Continue keppra for sz ppx x7d and maintenance of SBP <140 L superior endplate compression fracture - NSGY on board, no intervention  L frontal scalp hematoma - local wound care with warm compresses FEN - NPO, MIVF, will discuss with family regarding placement  of feeding tube for enteral access DVT - SCDs, hold chemical ppx due to worsened ICH Dispo - 4N, palliative care likely to see today   Diamantina Monks, MD Trauma & General Surgery Please use [email or web address] to contact on call provider  02/25/2019  *Care during the described time interval was provided by me. I have reviewed this patient's available data, including medical history, events of note, physical examination and test results as part of my evaluation.

## 2019-02-25 NOTE — Progress Notes (Signed)
SLP Cancellation Note  Patient Details Name: Teresa Beltran MRN: [id number] DOB: Jan 11, 1927   Cancelled treatment:       Reason Eval/Treat Not Completed: Medical issues which prohibited therapy. Pt not appropriate for speech-language evaluation. CT Head repeated this morning, results noted. Per discussion with RN, will sign off for now. Please reorder if needed.     Mahala Menghini., M.A. CCC-SLP Acute Rehabilitation Services Pager [phone] Office [phone]  02/25/2019, 8:57 AM

## 2019-02-25 NOTE — Progress Notes (Signed)
Subjective: Patient more drowsy today  Objective: Vital signs in last 24 hours: Temp:  [97.5 F (36.4 C)-98.7 F (37.1 C)] 97.7 F (36.5 C) (02/16 0800) Pulse Rate:  [58-92] 62 (02/16 0900) Resp:  [12-20] 15 (02/16 0900) BP: (94-146)/(46-79) 117/56 (02/16 0900) SpO2:  [96 %-100 %] 100 % (02/16 0900) Weight:  [51.3 kg] 51.3 kg (02/16 0148)  Intake/Output from previous day: 02/15 0701 - 02/16 0700 In: 2196.2 [I.V.:1503.7; IV Piggyback:692.6] Out: 1400 [Urine:1400] Intake/Output this shift: Total I/O In: 339.6 [I.V.:165.1; IV Piggyback:174.5] Out: -   Eyes open to stim, PERRL Localizing briskly and purposefully x 4 Murmuring but nonconversant GCS 9  Lab Results: Recent Labs    02/24/19 0438 02/25/19 0505  WBC 23.8* 9.3  HGB 14.3 13.0  HCT 45.0 39.4  PLT 257 214   BMET Recent Labs    02/24/19 0438 02/25/19 0505  NA 140 140  K 3.8 3.5  CL 105 108  CO2 23 22  GLUCOSE 187* 131*  BUN 20 14  CREATININE 0.95 0.78  CALCIUM 9.1 8.8*    Studies/Results: DG Pelvis 1-2 Views  Result Date: 02/24/2019 CLINICAL DATA:  Fall down 10 stairs. EXAM: PELVIS - 1-2 VIEW COMPARISON:  None. FINDINGS: Cortical irregularity of the right superior and inferior pubic rami. Pubic symphysis and sacroiliac joints are congruent. Both femoral heads are well-seated in the respective acetabula. Stool ball distends the rectum. IMPRESSION: Cortical irregularity of the right superior and inferior pubic rami, suspicious for fracture, but age indeterminate. Electronically Signed   By: Narda Rutherford M.D.   On: 02/24/2019 05:37   CT HEAD WO CONTRAST  Result Date: 02/25/2019 CLINICAL DATA:  Intracranial hemorrhage follow EXAM: CT HEAD WITHOUT CONTRAST TECHNIQUE: Contiguous axial images were obtained from the base of the skull through the vertex without intravenous contrast. COMPARISON:  02/24/2019 at 4:48 a.m. FINDINGS: Brain: Posterior left hemisphere intraparenchymal hematoma with adjacent  subarachnoid blood measures 6.0 x 2.4 x 4.6 cm (volume = 35 cm^3), previously 3.8 x 2.2 x 5.2 cm (volume = 23 cm^3). There is redistribution of intraparenchymal blood, now with a hematocrit level. Superior right convexity intraparenchymal hematoma with overlying subarachnoid hemorrhage measures approximately 2.6 x 4.2 x 2.9 cm (volume = 17 cm^3), previously 1.7 x 2.0 x 3.8 cm (volume = 6.8 cm^3). There is rightward midline shift of 4 mm. Size and configuration of the ventricles are unchanged. Vascular: No hyperdense vessel or unexpected calcification. Skull: Decreased size of left frontal scalp hematoma. No skull fracture. Sinuses/Orbits: Sinuses are clear. Normal orbits. Other: None IMPRESSION: 1. Increased size of bilateral intraparenchymal hematomas with overlying subarachnoid blood. 2. Now 4 mm rightward midline shift. 3. Decreased size of left frontal scalp hematoma. Electronically Signed   By: Deatra Robinson M.D.   On: 02/25/2019 00:51   CT HEAD WO CONTRAST  Result Date: 02/24/2019 CLINICAL DATA:  Poly trauma due to fall down steps. EXAM: CT HEAD WITHOUT CONTRAST CT CERVICAL SPINE WITHOUT CONTRAST TECHNIQUE: Multidetector CT imaging of the head and cervical spine was performed following the standard protocol without intravenous contrast. Multiplanar CT image reconstructions of the cervical spine were also generated. COMPARISON:  None. FINDINGS: CT HEAD FINDINGS Brain: Up to 4 cm left occipital hematoma with thin rim of edema. There is moderate regional subarachnoid hemorrhage. High right frontal parenchymal hematoma also measuring up to 4 cm anterior to posterior and with local subarachnoid hemorrhage. Small volume subarachnoid hemorrhage and probable contusion along the inferolateral left temporal lobe. Small subdural hematoma along  the left tentorium measuring 3-4 mm in maximal thickness. Trace subarachnoid hemorrhage is also seen along the right occipital convexity and parasagittal left frontal  convexity. No acute infarct, hydrocephalus, or masslike finding. Medial temporal volume loss which is moderate. Vascular: No hyperdense vessel or unexpected calcification. Skull: Negative for fracture. There is a large left frontal scalp hematoma. Sinuses/Orbits: No evidence of injury CT CERVICAL SPINE FINDINGS Alignment: No traumatic malalignment Skull base and vertebrae: Negative for acute fracture Soft tissues and spinal canal: No prevertebral fluid or swelling. No visible canal hematoma. Disc levels:  Ordinary degenerative changes Upper chest: No acute finding. Calcified granuloma at the left apex. Other: Critical Value/emergent results were called by telephone at the time of interpretation on 02/24/2019 at 4:59 am to provider [facility] , who verbally acknowledged these results. IMPRESSION: 1. History of trauma with right frontal and left occipital parenchymal hematomas measuring up to 4 cm each. There is also multifocal subarachnoid hemorrhage and a trace left tentorial subdural hemorrhage. 2. Left frontal scalp hematoma without calvarial fracture. 3. Negative for cervical spine fracture. Electronically Signed   By: Monte Fantasia M.D.   On: 02/24/2019 05:03   CT CHEST W CONTRAST  Result Date: 02/24/2019 CLINICAL DATA:  Fall down 10 stairs. Chest trauma, mod-severe; Abdominal trauma EXAM: CT CHEST, ABDOMEN, AND PELVIS WITH CONTRAST TECHNIQUE: Multidetector CT imaging of the chest, abdomen and pelvis was performed following the standard protocol during bolus administration of intravenous contrast. CONTRAST:  196mL OMNIPAQUE IOHEXOL 300 MG/ML  SOLN COMPARISON:  Chest and pelvic radiographs earlier this day. FINDINGS: CT CHEST FINDINGS Cardiovascular: Aortic atherosclerosis and tortuosity without acute aortic abnormality. Heart is normal in size. No pericardial fluid. Mediastinum/Nodes: No mediastinal hemorrhage or hematoma. Patulous esophagus with distal esophageal wall thickening. No  pneumomediastinum. No enlarged mediastinal or hilar lymph nodes. Calcified mediastinal and left hilar nodes consistent with prior granulomatous disease. Lungs/Pleura: No pneumothorax. No pulmonary contusion. Calcified granuloma at the left lung apex. Subsegmental atelectasis the right lung base. Mild emphysema. No pleural fluid. Musculoskeletal: No acute fracture of the ribs, sternum, included clavicles or shoulder girdles. Remote fracture of the left proximal humerus. No acute fracture of the thoracic spine. CT ABDOMEN PELVIS FINDINGS Hepatobiliary: No hepatic injury or perihepatic hematoma. Gallbladder is unremarkable. Pancreas: No evidence of injury. 6 mm cyst about the uncinate process. No ductal dilatation or inflammation. Spleen: No splenic injury or perisplenic hematoma. Adrenals/Urinary Tract: No adrenal hemorrhage or renal injury identified. Simple cyst in the upper left kidney. Lobulated 3.3 x 1.7 x 3.3 cm hyperdensity in the right base of the urinary bladder, likely enhancing. Bladder physiologically distended without wall thickening. Stomach/Bowel: No evidence of bowel injury. No bowel inflammation. No mesenteric hematoma. Colonic diverticulosis most prominent in the distal colon. Increased stool burden in the rectosigmoid colon with rectal distention of 8.9 cm. Mild distal rectal wall thickening. Vascular/Lymphatic: Aorto bi-iliac atherosclerosis. No aortic injury. No retroperitoneal fluid. No enlarged lymph nodes in the abdomen or pelvis. Reproductive: Status post hysterectomy. No adnexal masses. Other: Subcutaneous injury with left lateral flank/body wall subcutaneous contusion measuring approximately 3.6 x 1.6 cm with small focus of active bleeding series 3, image 75. Adjacent surrounding patchy edema. No free fluid or free air in the abdomen. Musculoskeletal: L2 superior endplate compression fracture with buckling of the posterior cortex and approximately 25% loss of height anteriorly. No posterior  element extension. No additional fracture of the lumbar spine. No pelvic fracture, particularly, the right pubic rami are intact. IMPRESSION: 1. Subcutaneous  soft tissue hematoma to the left lateral flank/body wall with small focus of active bleeding. 2. L2 superior endplate compression fracture with buckling of the posterior cortex and approximately 25% loss of height anteriorly is likely acute. 3. No additional acute traumatic injury to the abdomen or pelvis. No acute traumatic injury to the chest. 4. Lobulated 3.3 cm hyperdense lesion in the right base of the urinary bladder, suspicious for bladder neoplasm. Recommend Urology consultation. 5. Colonic diverticulosis without acute inflammation. Increased distal stool burden with stool ball distending the rectum suspicious for fecal impaction. There is rectal wall thickening. 6. Patulous esophagus with distal esophageal wall thickening, may be reflux or esophagitis. Aortic Atherosclerosis (ICD10-I70.0) and Emphysema (ICD10-J43.9). Electronically Signed   By: Narda Rutherford M.D.   On: 02/24/2019 06:06   CT CERVICAL SPINE WO CONTRAST  Result Date: 02/24/2019 CLINICAL DATA:  Poly trauma due to fall down steps. EXAM: CT HEAD WITHOUT CONTRAST CT CERVICAL SPINE WITHOUT CONTRAST TECHNIQUE: Multidetector CT imaging of the head and cervical spine was performed following the standard protocol without intravenous contrast. Multiplanar CT image reconstructions of the cervical spine were also generated. COMPARISON:  None. FINDINGS: CT HEAD FINDINGS Brain: Up to 4 cm left occipital hematoma with thin rim of edema. There is moderate regional subarachnoid hemorrhage. High right frontal parenchymal hematoma also measuring up to 4 cm anterior to posterior and with local subarachnoid hemorrhage. Small volume subarachnoid hemorrhage and probable contusion along the inferolateral left temporal lobe. Small subdural hematoma along the left tentorium measuring 3-4 mm in maximal  thickness. Trace subarachnoid hemorrhage is also seen along the right occipital convexity and parasagittal left frontal convexity. No acute infarct, hydrocephalus, or masslike finding. Medial temporal volume loss which is moderate. Vascular: No hyperdense vessel or unexpected calcification. Skull: Negative for fracture. There is a large left frontal scalp hematoma. Sinuses/Orbits: No evidence of injury CT CERVICAL SPINE FINDINGS Alignment: No traumatic malalignment Skull base and vertebrae: Negative for acute fracture Soft tissues and spinal canal: No prevertebral fluid or swelling. No visible canal hematoma. Disc levels:  Ordinary degenerative changes Upper chest: No acute finding. Calcified granuloma at the left apex. Other: Critical Value/emergent results were called by telephone at the time of interpretation on 02/24/2019 at 4:59 am to provider [facility] , who verbally acknowledged these results. IMPRESSION: 1. History of trauma with right frontal and left occipital parenchymal hematomas measuring up to 4 cm each. There is also multifocal subarachnoid hemorrhage and a trace left tentorial subdural hemorrhage. 2. Left frontal scalp hematoma without calvarial fracture. 3. Negative for cervical spine fracture. Electronically Signed   By: Marnee Spring M.D.   On: 02/24/2019 05:03   CT ABDOMEN PELVIS W CONTRAST  Result Date: 02/24/2019 CLINICAL DATA:  Fall down 10 stairs. Chest trauma, mod-severe; Abdominal trauma EXAM: CT CHEST, ABDOMEN, AND PELVIS WITH CONTRAST TECHNIQUE: Multidetector CT imaging of the chest, abdomen and pelvis was performed following the standard protocol during bolus administration of intravenous contrast. CONTRAST:  OMNIPAQUE IOHEXOL 300 MG/ML  SOLN COMPARISON:  Chest and pelvic radiographs earlier this day. FINDINGS: CT CHEST FINDINGS Cardiovascular: Aortic atherosclerosis and tortuosity without acute aortic abnormality. Heart is normal in size. No pericardial fluid.  Mediastinum/Nodes: No mediastinal hemorrhage or hematoma. Patulous esophagus with distal esophageal wall thickening. No pneumomediastinum. No enlarged mediastinal or hilar lymph nodes. Calcified mediastinal and left hilar nodes consistent with prior granulomatous disease. Lungs/Pleura: No pneumothorax. No pulmonary contusion. Calcified granuloma at the left lung apex. Subsegmental atelectasis the right lung  base. Mild emphysema. No pleural fluid. Musculoskeletal: No acute fracture of the ribs, sternum, included clavicles or shoulder girdles. Remote fracture of the left proximal humerus. No acute fracture of the thoracic spine. CT ABDOMEN PELVIS FINDINGS Hepatobiliary: No hepatic injury or perihepatic hematoma. Gallbladder is unremarkable. Pancreas: No evidence of injury. 6 mm cyst about the uncinate process. No ductal dilatation or inflammation. Spleen: No splenic injury or perisplenic hematoma. Adrenals/Urinary Tract: No adrenal hemorrhage or renal injury identified. Simple cyst in the upper left kidney. Lobulated 3.3 x 1.7 x 3.3 cm hyperdensity in the right base of the urinary bladder, likely enhancing. Bladder physiologically distended without wall thickening. Stomach/Bowel: No evidence of bowel injury. No bowel inflammation. No mesenteric hematoma. Colonic diverticulosis most prominent in the distal colon. Increased stool burden in the rectosigmoid colon with rectal distention of 8.9 cm. Mild distal rectal wall thickening. Vascular/Lymphatic: Aorto bi-iliac atherosclerosis. No aortic injury. No retroperitoneal fluid. No enlarged lymph nodes in the abdomen or pelvis. Reproductive: Status post hysterectomy. No adnexal masses. Other: Subcutaneous injury with left lateral flank/body wall subcutaneous contusion measuring approximately 3.6 x 1.6 cm with small focus of active bleeding series 3, image 75. Adjacent surrounding patchy edema. No free fluid or free air in the abdomen. Musculoskeletal: L2 superior endplate  compression fracture with buckling of the posterior cortex and approximately 25% loss of height anteriorly. No posterior element extension. No additional fracture of the lumbar spine. No pelvic fracture, particularly, the right pubic rami are intact. IMPRESSION: 1. Subcutaneous soft tissue hematoma to the left lateral flank/body wall with small focus of active bleeding. 2. L2 superior endplate compression fracture with buckling of the posterior cortex and approximately 25% loss of height anteriorly is likely acute. 3. No additional acute traumatic injury to the abdomen or pelvis. No acute traumatic injury to the chest. 4. Lobulated 3.3 cm hyperdense lesion in the right base of the urinary bladder, suspicious for bladder neoplasm. Recommend Urology consultation. 5. Colonic diverticulosis without acute inflammation. Increased distal stool burden with stool ball distending the rectum suspicious for fecal impaction. There is rectal wall thickening. 6. Patulous esophagus with distal esophageal wall thickening, may be reflux or esophagitis. Aortic Atherosclerosis (ICD10-I70.0) and Emphysema (ICD10-J43.9). Electronically Signed   By: Narda Rutherford M.D.   On: 02/24/2019 06:06   DG Chest Port 1 View  Result Date: 02/24/2019 CLINICAL DATA:  Fall down 10 stairs. EXAM: PORTABLE CHEST 1 VIEW COMPARISON:  None. FINDINGS: Patient is rotated.The cardiomediastinal contours are normal for rotation. Multiple skin folds project over both hemi thoraces. Calcified granuloma at the left lung apex. Pulmonary vasculature is normal. No consolidation, pleural effusion, or pneumothorax. Chronic deformity of the left proximal humerus. IMPRESSION: No evidence of acute traumatic injury. Electronically Signed   By: Narda Rutherford M.D.   On: 02/24/2019 05:36   DG Knee Complete 4 Views Right  Result Date: 02/24/2019 CLINICAL DATA:  Pain and swelling.  Fall EXAM: RIGHT KNEE - COMPLETE 4+ VIEW COMPARISON:  None. FINDINGS: Negative for  fracture. Moderate degenerative changes in the knee. Medial joint space narrowing and spurring. Small osteochondral defect in the medial femoral condyle has sclerosis and is likely chronic. No loose body. Lateral joint space widening and spurring. Possible small joint effusion. IMPRESSION: Negative for fracture. Degenerative change in the knee most prominent in the medial joint compartment. Electronically Signed   By: Marlan Palau M.D.   On: 02/24/2019 10:53    Assessment/Plan: 86 yo F s/p fall with multiple intraparenchymal hemorrhages, traumatic SAH,  small falcotentorial SDH. - her CT this morning shows interval increase in size of hematomas.  The left parietal lobe hematoma now has more local mass effect with 3-4 mm MLS.  Right frontal hematoma with mildly increased size, surrounding SAH.  Left temporal lobe hematoma slightly larger.  Cisterns remain patent.  - I had a long discussion with the patient's daughter Thayer Ohm.  I explained that it is possible these hematomas could continue to worsen and become life-threatening.  However, surgical evacuation would not change her prognosis for functional recovery.  Given the considerable volume of traumatized brain and her age, I expect a prolonged recovery with significant permanent deficits, both cognitive and focal. Consistent with the patient's wishes, the daughter would not want an escalation in care with ventilation, permanent feeding tube, nor a brain surgery that would offer poor functional benefit.  I did explain it is possible we may see stabilization and some improvement in her neurologic exam over the coming weeks, but she would still require prolonged recovery regardless. - regarding her mild L2 compression fracture, radiographically it is age indeterminate and with her neuro status I cannot tell clinically it is acute, but given the context of her injury, I will treat it as an acute fracture, and as she cannot communicate her pain clearly, would  recommend a TLSO brace when being mobilized OOB.  Bedelia Person 02/25/2019, 10:11 AM

## 2019-02-26 MED ORDER — METHOCARBAMOL 1000 MG/10ML IJ SOLN
1000.0000 mg | Freq: Three times a day (TID) | INTRAVENOUS | Status: DC | PRN
  Filled 2019-02-26: qty 10

## 2019-02-26 MED ORDER — MORPHINE SULFATE (PF) 2 MG/ML IV SOLN
2.0000 mg | Freq: Four times a day (QID) | INTRAVENOUS | Status: DC
  Administered 2019-02-26 – 2019-02-28 (×8): 2 mg via INTRAVENOUS
  Filled 2019-02-26 (×8): qty 1

## 2019-02-26 MED ORDER — WHITE PETROLATUM EX OINT
TOPICAL_OINTMENT | CUTANEOUS | Status: AC
  Filled 2019-02-26: qty 28.35

## 2019-02-26 NOTE — TOC Initial Note (Signed)
Transition of Care [facility]) - Initial/Assessment Note    Patient Details  Name: Teresa Beltran MRN: [id number] Date of Birth: 10-20-1927  Transition of Care [facility]) CM/SW Contact:    Glennon Mac, RN Phone Number: 02/26/2019, 4:00 PM  Clinical Narrative:  Pt admitted on 2/15 after falling downstairs at her daughter's home.  Pt sustained multiple traumatic brain contusions/ICHs due to fall, and has now transitioned to comfort care.  PTA, pt lived independently at a Senior Living facility.  Family prefers discharge to residential hospice facility, preferably [facility].  Referral has been made to [facility] to assist with transition to residential hospice facility.                   Expected Discharge Plan: Hospice Medical Facility Barriers to Discharge: Continued Medical Work up   Patient Goals and CMS Choice     Choice offered to / list presented to : Adult Children  Expected Discharge Plan and Services Expected Discharge Plan: Hospice Medical Facility   Discharge Planning Services: CM Consult Post Acute Care Choice: Hospice Living arrangements for the past 2 months: Independent Living Facility                                      Prior Living Arrangements/Services Living arrangements for the past 2 months: Independent Living Facility Lives with:: Self Patient language and need for interpreter reviewed:: Yes        Need for Family Participation in Patient Care: Yes (Comment) Care giver support system in place?: Yes (comment)   Criminal Activity/Legal Involvement Pertinent to Current Situation/Hospitalization: No - Comment as needed  Activities of Daily Living   ADL Screening (condition at time of admission) Patient's cognitive ability adequate to safely complete daily activities?: No Is the patient deaf or have difficulty hearing?: No Does the patient have difficulty seeing, even when wearing glasses/contacts?: Yes Does the patient have difficulty concentrating,  remembering, or making decisions?: Yes Patient able to express need for assistance with ADLs?: No Does the patient have difficulty dressing or bathing?: Yes Independently performs ADLs?: No Does the patient have difficulty walking or climbing stairs?: Yes Weakness of Legs: Both Weakness of Arms/Hands: Both  Permission Sought/Granted                  Emotional Assessment Appearance:: Appears stated age Attitude/Demeanor/Rapport: Unable to Assess Affect (typically observed): Unable to Assess        Admission diagnosis:  ICH (intracerebral hemorrhage) (HCC) [I61.9] Traumatic hemorrhage of cerebrum without loss of consciousness, unspecified laterality, initial encounter [facility]) [S06.360A] Patient Active Problem List   Diagnosis Date Noted  . Goals of care, counseling/discussion   . Palliative care by specialist   . ICH (intracerebral hemorrhage) (HCC) 02/24/2019   PCP:  Patient, No Pcp Per Pharmacy:   [facility] [address] [place], [place] - [address] [place] [address] Phone: [phone] Fax: [phone]  [id number] - [place], [place] - [address] [facility] [address] [place] [address] Phone: [phone] Fax: [phone]     Social Determinants of Health (SDOH) Interventions    Readmission Risk Interventions No flowsheet data found.  Quintella Baton, RN, BSN  Trauma/Neuro ICU Case Manager [phone]

## 2019-02-26 NOTE — Progress Notes (Signed)
Palliative:  HPI: 86 y.o. female  with past medical history of appendectomy, hysterectomy admitted on 02/24/2019 with fall down stairs with moderate sized parenchymal hematomas to right frontal and left occipital measuring 4 cm each with multifocal subarachnoid hemorrhage and trace left tentorial subdural hemorrhage. Also with L2 compression fracture. Full comfort care decided 02/25/19.   Teresa Beltran appears comfortable this morning but also progressing towards end of life. She is unresponsive and recently required morphine for comfort.   I returned to Teresa Beltran' bedside and daughter, Teresa Beltran, is at bedside. Teresa Beltran shares that her sister is coming from [place] today and that her husband and daughter have been able to have time with Teresa Beltran today as well. She appears overall comfortable with moments of restlessness. I will schedule some medication to better ensure her comfort.   All questions/concerns addressed. Emotional support provided.   Exam: Unresponsive. Thin, frail, elderly. Non-purposeful movement of right extremities at times. Breathing regular, unlabored. No apnea. Generalized weakness. Warm to touch.   Plan: - Full comfort care.  - Awaiting transition to [facility].   20 min  Yong Channel, NP Palliative Medicine Team Pager [phone] (Please see [email or web address] for schedule) Team Phone [phone]    Greater than 50%  of this time was spent counseling and coordinating care related to the above assessment and plan

## 2019-02-26 NOTE — Progress Notes (Addendum)
   Trauma/Critical Care Follow Up Note  Subjective:    Overnight Issues: NAEON, transition to comfort care  Objective:  Vital signs for last 24 hours: Temp:  [97.6 F (36.4 C)] 97.6 F (36.4 C) (02/16 1200) Pulse Rate:  [60-105] 96 (02/17 0600) Resp:  [12-22] 22 (02/17 0600) BP: (115-156)/(55-104) 151/67 (02/17 0600) SpO2:  [95 %-100 %] 98 % (02/17 0600)  Hemodynamic parameters for last 24 hours:    Intake/Output from previous day: 02/16 0701 - 02/17 0700 In: 2619.7 [I.V.:1553.1; IV Piggyback:1066.6] Out: 1500 [Urine:1500]  Intake/Output this shift: No intake/output data recorded.  Vent settings for last 24 hours:    Physical Exam:  Gen: comfortable, no distress Neuro: non-verbal HEENT: PERRL Neck: supple CV: RRR Pulm: unlabored breathing Abd: soft, NT GU: clear yellow urine Extr: wwp, no edema   No results found for this or any previous visit (from the past 24 hour(s)).  Assessment & Plan: The plan of care was discussed with the bedside nurse for the day, Candise Bowens, who is in agreement with this plan and no additional concerns were raised.   Present on Admission: . ICH (intracerebral hemorrhage) (HCC)    LOS: 2 days   Additional comments:I reviewed the patient's new clinical lab test results.   and I reviewed the patients new imaging test results.    89F s/p fall down stairs  R frontal and L occipital IPH, multi-focal SAH, L tentorial SDH - NSGY c/s (Dr. Maisie Fus), worsened head CT this AM. Continue keppra for sz ppx x7d and maintenance of SBP <140 L superior endplate compression fracture - NSGY on board, no intervention  L frontal scalp hematoma - local wound care with warm compresses FEN - NPO, MIVF, no enteral access per family request and transition to comfort care DVT - SCDs, hold chemical ppx due to transition to comfort care Dispo - possible transfer to hospice pending family   Diamantina Monks, MD Trauma & General Surgery Please use [email or web address] to  contact on call provider  02/26/2019  *Care during the described time interval was provided by me. I have reviewed this patient's available data, including medical history, events of note, physical examination and test results as part of my evaluation.

## 2019-02-26 NOTE — Care Management (Signed)
TOC Referral for residential hospice/[facility] preferred.  Referral called in to [facility] with [facility], who will coordinate dc to Hospice Home. Will follow with updates as available.   Quintella Baton, RN, BSN  Trauma/Neuro ICU Case Manager [phone]

## 2019-02-26 NOTE — Progress Notes (Addendum)
Manufacturing systems engineer [facility]) Hospital Liaison note.    Received request from [facility] manager Sidney Ace, RN for family interest in [facility]. [facility] is unable to offer a room today. Spoke with daughter Alvia Jablonski to answer questions and explain services. Hospital Liaison will follow up tomorrow or sooner if a room becomes available.    A Please do not hesitate to call with questions.    Thank you,   Elsie Saas, RN, CCM      [facility] Liaison (listed on AMION under [facility] [place])    [phone]

## 2019-02-26 NOTE — Progress Notes (Signed)
Pt arrived to 6N16 via bed from 4N. Received report from [place], Charity fundraiser. See assessment. Will continue to monitor.

## 2019-02-26 NOTE — Progress Notes (Signed)
Subjective: Patient more unresponsive today  Objective: Vital signs in last 24 hours: Temp:  [97.6 F (36.4 C)] 97.6 F (36.4 C) (02/16 1200) Pulse Rate:  [60-105] 96 (02/17 0600) Resp:  [13-22] 22 (02/17 0600) BP: (115-156)/(55-104) 151/67 (02/17 0600) SpO2:  [95 %-100 %] 98 % (02/17 0600)  Intake/Output from previous day: 02/16 0701 - 02/17 0700 In: 2619.7 [I.V.:1553.1; IV Piggyback:1066.6] Out: 1500 [Urine:1500] Intake/Output this shift: No intake/output data recorded.  Eyes closed to stim, PERRL Localizes weakly b/l Nonverbal  Lab Results: Recent Labs    02/24/19 0438 02/25/19 0505  WBC 23.8* 9.3  HGB 14.3 13.0  HCT 45.0 39.4  PLT 257 214   BMET Recent Labs    02/24/19 0438 02/25/19 0505  NA 140 140  K 3.8 3.5  CL 105 108  CO2 23 22  GLUCOSE 187* 131*  BUN 20 14  CREATININE 0.95 0.78  CALCIUM 9.1 8.8*    Studies/Results: CT HEAD WO CONTRAST  Result Date: 02/25/2019 CLINICAL DATA:  Intracranial hemorrhage follow EXAM: CT HEAD WITHOUT CONTRAST TECHNIQUE: Contiguous axial images were obtained from the base of the skull through the vertex without intravenous contrast. COMPARISON:  02/24/2019 at 4:48 a.m. FINDINGS: Brain: Posterior left hemisphere intraparenchymal hematoma with adjacent subarachnoid blood measures 6.0 x 2.4 x 4.6 cm (volume = 35 cm^3), previously 3.8 x 2.2 x 5.2 cm (volume = 23 cm^3). There is redistribution of intraparenchymal blood, now with a hematocrit level. Superior right convexity intraparenchymal hematoma with overlying subarachnoid hemorrhage measures approximately 2.6 x 4.2 x 2.9 cm (volume = 17 cm^3), previously 1.7 x 2.0 x 3.8 cm (volume = 6.8 cm^3). There is rightward midline shift of 4 mm. Size and configuration of the ventricles are unchanged. Vascular: No hyperdense vessel or unexpected calcification. Skull: Decreased size of left frontal scalp hematoma. No skull fracture. Sinuses/Orbits: Sinuses are clear. Normal orbits. Other: None  IMPRESSION: 1. Increased size of bilateral intraparenchymal hematomas with overlying subarachnoid blood. 2. Now 4 mm rightward midline shift. 3. Decreased size of left frontal scalp hematoma. Electronically Signed   By: Deatra Robinson M.D.   On: 02/25/2019 00:51    Assessment/Plan: 86 yo F with multiple traumatic brain contusions/ICHs after fall.  In the face of neurologic decline and consistent with the patient's wishes to avoid a prolonged recovery with little chance of functional independence, she is transitioning to hospice care.  Please call with questions.  LOS: 2 days     Teresa Beltran 02/26/2019, 11:24 AM

## 2019-02-27 MED ORDER — LORAZEPAM 2 MG/ML IJ SOLN
1.0000 mg | Freq: Two times a day (BID) | INTRAMUSCULAR | Status: DC
  Administered 2019-02-27 – 2019-02-28 (×2): 1 mg via INTRAVENOUS
  Filled 2019-02-27 (×2): qty 1

## 2019-02-27 MED ORDER — LORAZEPAM 2 MG/ML IJ SOLN: 1.0000 mg | INTRAMUSCULAR | Status: DC | PRN

## 2019-02-27 NOTE — Progress Notes (Signed)
[facility] Documentation  Pt has been approved to transfer to [facility] for EOL care but there are currently no beds available. TOC and daughter notified of approval. Will communicate bed availability tomorrow.   Please reach out with any questions and thank you for the referral.   Trena Platt, RN [facility] Liaison  [phone]

## 2019-02-27 NOTE — TOC Progression Note (Signed)
Transition of Care [facility]) - Progression Note    Patient Details  Name: Teresa Beltran MRN: [id number] Date of Birth: 1927-03-02  Transition of Care [facility]) CM/SW Contact  Astrid Drafts Berna Spare, RN Phone Number: 02/27/2019, 12:41 PM  Clinical Narrative:   Per Trena Platt with [facility], pt has been approved for [facility] residential hospice facility, but no bed available today.  Will follow; provide updates on bed availability as available.     Expected Discharge Plan: Hospice Medical Facility Barriers to Discharge: Continued Medical Work up  Expected Discharge Plan and Services Expected Discharge Plan: Hospice Medical Facility   Discharge Planning Services: CM Consult Post Acute Care Choice: Hospice Living arrangements for the past 2 months: Independent Living Facility                                       Social Determinants of Health (SDOH) Interventions    Readmission Risk Interventions No flowsheet data found.  Quintella Baton, RN, BSN  Trauma/Neuro ICU Case Manager [phone]

## 2019-02-27 NOTE — Progress Notes (Signed)
Palliative:  HPI: 86 y.o.femalewith past medical history of appendectomy, hysterectomyadmitted on 2/15/2021with fall down stairs with moderate sized parenchymal hematomas to right frontal and left occipital measuring 4 cm each with multifocal subarachnoid hemorrhage and trace left tentorial subdural hemorrhage.Also with L2 compression fracture.Full comfort care decided 02/25/19.   I met today with Teresa Beltran' daughter, Teresa Beltran and Teresa Beltran. We discussed Teresa Beltran' current status and prognosis. I believe she is still stable for transition to [facility] if bed is available. Family is accepting of transition to [facility] but okay if she has hospital death as well. They report a peaceful night except for one episode of agitation which improved after morphine.   They share memories and stories of their mother. Therapeutic listening and emotional support provided to daughters.   Exam: Unresponsive. Thin, elderly. Breathing regular, unlabored (although daughters report more shallow breathing and occasional apnea episodes). Abd flat, soft. Generalized weakness. No purposeful movement.   Plan: - Full comfort care.  - Awaiting [facility] bed.  - Continue to reassess appropriateness/stability for transition to hospice but she appears stable for transfer as of today. Discussed with daughters.  - I will have my colleagues continue to check in as I am off after today.   [place], NP Palliative Medicine Team Pager [phone] (Please see [email or web address] for schedule) Team Phone [phone]    Greater than 50%  of this time was spent counseling and coordinating care related to the above assessment and plan

## 2019-02-27 NOTE — Progress Notes (Signed)
[facility] Progress Note     Subjective: Patient appears comfortable, family at bedside and in agreement. Plan to transition to hospice facility when bed available.   Review of Systems  Unable to perform ROS: Patient unresponsive     Objective: Vital signs in last 24 hours: Pulse Rate:  [82-100] 82 (02/17 1400) Resp:  [15-24] 17 (02/17 1400) BP: (132-155)/(69-94) 132/69 (02/17 1000) SpO2:  [99 %] 99 % (02/17 1400) Last BM Date: (PTA)  Intake/Output from previous day: 02/17 0701 - 02/18 0700 In: 518.1 [I.V.:318.1; IV Piggyback:200] Out: 1500 [Urine:1500] Intake/Output this shift: No intake/output data recorded.  PE: General: WD, thin white female, unresponsive, appears comfortable HEENT: left frontal scalp hematoma. Ears and nose without any masses or lesions.  Mouth is pink and moist Heart: regular, rate, and rhythm.  Normal s1,s2. No obvious murmurs, gallops, or rubs noted.  Palpable radial and pedal pulses bilaterally Lungs: CTAB, no wheezes, rhonchi, or rales noted.  Respiratory effort nonlabored Abd: soft, NT, ND,  MS: all 4 extremities are symmetrical with no cyanosis, clubbing, or edema. Skin: warm and dry with no masses, lesions, or rashes   Lab Results:  Recent Labs    02/25/19 0505  WBC 9.3  HGB 13.0  HCT 39.4  PLT 214   BMET Recent Labs    02/25/19 0505  NA 140  K 3.5  CL 108  CO2 22  GLUCOSE 131*  BUN 14  CREATININE 0.78  CALCIUM 8.8*   PT/INR No results for input(s): LABPROT, INR in the last 72 hours. CMP     Component Value Date/Time   NA 140 02/25/2019 0505   K 3.5 02/25/2019 0505   CL 108 02/25/2019 0505   CO2 22 02/25/2019 0505   GLUCOSE 131 (H) 02/25/2019 0505   BUN 14 02/25/2019 0505   CREATININE 0.78 02/25/2019 0505   CALCIUM 8.8 (L) 02/25/2019 0505   PROT 6.4 (L) 02/24/2019 0438   ALBUMIN 3.2 (L) 02/24/2019 0438   AST 27 02/24/2019 0438   ALT 19 02/24/2019 0438   ALKPHOS 80 02/24/2019 0438   BILITOT 0.8  02/24/2019 0438   GFRNONAA >60 02/25/2019 0505   GFRAA >60 02/25/2019 0505   Lipase  No results found for: LIPASE     Studies/Results: No results found.  Anti-infectives: Anti-infectives (From admission, onward)   None       Assessment/Plan 29F s/p fall down stairs  R frontal and L occipital IPH, multi-focal SAH, L tentorial SDH - NSGY c/s (Dr. Maisie Fus), worsened head CT this AM. Continue keppra for sz ppx x7d and maintenance of SBP <140 L superior endplate compression fracture - NSGY on board, no intervention  L frontal scalp hematoma - local wound care with warm compresses  FEN - NPO, MIVF, no enteral access per family request and transition to comfort care DVT - SCDs, hold chemical ppx due to transition to comfort care Dispo -possible transfer to hospice  LOS: 3 days    Wells Guiles , [facility] 02/27/2019, 7:47 AM Please see Amion for pager number during day hours 7:00am-4:30pm

## 2019-02-28 NOTE — Progress Notes (Signed)
Report given to nurse Herbert Seta ) at [facility] . Family updated on plan of care , awaiting transport

## 2019-02-28 NOTE — TOC Transition Note (Signed)
Transition of Care [facility]) - CM/SW Discharge Note   Patient Details  Name: Teresa Beltran MRN: [id number] Date of Birth: March 14, 1927  Transition of Care [facility]) CM/SW Contact:  Doy Hutching, LCSW Phone Number: 02/28/2019, 10:51 AM   Clinical Narrative:    Pt accepted at [facility], paperwork complete. DNR signed and PTAR papers on chart. Discharge summary to be sent to [facility]. Await RN availability to dc pt with PTAR.    Final next level of care: Hospice Medical Facility Barriers to Discharge: No Barriers Identified   Patient Goals and CMS Choice  Choice offered to / list presented to : Adult Children  Discharge Placement         Patient chooses bed at: Other - please specify in the comment section below:([facility]) Patient to be transferred to facility by: PTAR Name of family member notified: pt daughter Patient and family notified of of transfer: 02/28/19  Discharge Plan and Services Discharge Planning Services: CM Consult Post Acute Care Choice: Hospice            Readmission Risk Interventions No flowsheet data found.

## 2019-02-28 NOTE — Plan of Care (Signed)
  Problem: Clinical Measurements: Goal: Diagnostic test results will improve Outcome: Progressing   Problem: Pain Managment: Goal: General experience of comfort will improve Outcome: Progressing   Problem: Safety: Goal: Ability to remain free from injury will improve Outcome: Progressing   Problem: Skin Integrity: Goal: Risk for impaired skin integrity will decrease Outcome: Progressing   

## 2019-02-28 NOTE — Progress Notes (Signed)
[facility] Documentation     Pt has been approved for [facility] transfer. [facility] does have a bed available for pt today. Paperwork has been completed and transportation can be arranged.      Please call [facility] at [phone] to give charge nurse report and fax discharge summary to [phone].     Please dc any lines. May leave catheter in place if pt has one. Please send pt to [facility] with DNR paperwork.      Please call with any questions.      Thank you,   Jennifer Love, RN  [facility] Documentation  

## 2019-02-28 NOTE — Social Work (Signed)
Clinical Social Worker facilitated patient discharge including contacting patient family and facility to confirm patient discharge plans.  Clinical information faxed to facility and family agreeable with plan.  CSW arranged ambulance transport via PTAR to [facility].  RN to call [phone] with report prior to discharge.  Clinical Social Worker will sign off for now as social work intervention is no longer needed. Please consult us again if new need arises.  Ashara Lounsbury, MSW, LCSW Clinical Social Worker   

## 2019-02-28 NOTE — Progress Notes (Signed)
[facility] Progress Note     Subjective: Patient unresponsive but appears comfortable. Family at bedside and reporting that she has not seemed agitated or fitful. 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   Review of Systems  Unable to perform ROS: Patient unresponsive     Objective: Vital signs in last 24 hours: Pulse Rate:  [105] 105 (02/19 0430) Resp:  [19] 19 (02/19 0430) SpO2:  [91 %] 91 % (02/19 0430) Last BM Date: (PTA)  Intake/Output from previous day: 02/18 0701 - 02/19 0700 In: 259.4 [I.V.:59.4; IV Piggyback:200] Out: 1650 [Urine:1650] Intake/Output this shift: No intake/output data recorded.  PE: General: WD, thin white female, unresponsive, appears comfortable HEENT: left frontal scalp hematoma. Ears and nose without any masses or lesions.  Mouth is pink and moist Heart: regular, rate, and rhythm.  Normal s1,s2. No obvious murmurs, gallops, or rubs noted.  Palpable radial and pedal pulses bilaterally Lungs: rhonchi bilaterally,  Respiratory effort nonlabored Abd: soft, NT, ND,  MS: all 4 extremities are symmetrical with no cyanosis, clubbing, or edema. Skin: warm and dry with no masses, lesions, or rashes   Lab Results:  No results for input(s): WBC, HGB, HCT, PLT in the last 72 hours. BMET No results for input(s): NA, K, CL, CO2, GLUCOSE, BUN, CREATININE, CALCIUM in the last 72 hours. PT/INR No results for input(s): LABPROT, INR in the last 72 hours. CMP     Component Value Date/Time   NA 140 02/25/2019 0505   K 3.5 02/25/2019 0505   CL 108 02/25/2019 0505   CO2 22 02/25/2019 0505   GLUCOSE 131 (H) 02/25/2019 0505   BUN 14 02/25/2019 0505   CREATININE 0.78 02/25/2019 0505   CALCIUM 8.8 (L) 02/25/2019 0505   PROT 6.4 (L) 02/24/2019 0438   ALBUMIN 3.2 (L) 02/24/2019 0438   AST 27 02/24/2019 0438   ALT 19 02/24/2019 0438   ALKPHOS 80 02/24/2019 0438   BILITOT 0.8 02/24/2019 0438   GFRNONAA >60 02/25/2019 0505   GFRAA >60 02/25/2019 0505   Lipase  No results found for: LIPASE     Studies/Results: No results found.  Anti-infectives: Anti-infectives (From admission, onward)   None       Assessment/Plan 68F s/p fall down stairs  R frontal and L occipital IPH, multi-focal SAH, L tentorial SDH- NSGY c/s (Dr. Maisie Fus), worsened head CT this AM. Continue keppra for sz ppx x7d and maintenance of SBP <140 L superior endplate compression fracture- NSGY on board, no intervention  L frontal scalp hematoma- local wound care with warm compresses  FEN- NPO, MIVF, no enteral access per family request and transition tocomfort care DVT- SCDs, hold chemical ppx due to transition to comfort care Dispo-approved for [facility], awaiting bed availability   LOS: 4 days    Wells Guiles , [facility] Surgery 02/28/2019, 7:22 AM Please see Amion for pager number during day hours 7:00am-4:30pm

## 2019-02-28 NOTE — Plan of Care (Signed)
Patient currently on Comfort Care Measures at this time. Family at the bedside. Hourly rounds continued, assessments for comfort and pain relief maintained.

## 2019-02-28 NOTE — Progress Notes (Signed)
Nutrition Brief Note  Chart reviewed. Pt now transitioning to comfort care.  No further nutrition interventions warranted at this time.  Please re-consult as needed.   Carvel Huskins W, RD, LDN, CDCES Registered Dietitian II Certified Diabetes Care and Education Specialist Please refer to AMION for RD and/or RD on-call/weekend/after hours pager  
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
## 2019-03-10 DEATH — deceased
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
